# Patient Record
Sex: Male | Born: 1965 | Race: White | Hispanic: No | Marital: Single | Smoking: Current every day smoker
Health system: Southern US, Community
[De-identification: ages and names within clinical notes are randomized; demographics above are authoritative.]

## PROBLEM LIST (undated history)

## (undated) DIAGNOSIS — T4145XA Adverse effect of unspecified anesthetic, initial encounter: Secondary | ICD-10-CM

## (undated) DIAGNOSIS — I4891 Unspecified atrial fibrillation: Secondary | ICD-10-CM

## (undated) DIAGNOSIS — M1990 Unspecified osteoarthritis, unspecified site: Secondary | ICD-10-CM

## (undated) DIAGNOSIS — I719 Aortic aneurysm of unspecified site, without rupture: Secondary | ICD-10-CM

## (undated) DIAGNOSIS — T8859XA Other complications of anesthesia, initial encounter: Secondary | ICD-10-CM

## (undated) DIAGNOSIS — K219 Gastro-esophageal reflux disease without esophagitis: Secondary | ICD-10-CM

## (undated) DIAGNOSIS — E119 Type 2 diabetes mellitus without complications: Secondary | ICD-10-CM

## (undated) DIAGNOSIS — Q2381 Bicuspid aortic valve: Secondary | ICD-10-CM

## (undated) DIAGNOSIS — Z91018 Allergy to other foods: Secondary | ICD-10-CM

## (undated) DIAGNOSIS — U071 COVID-19: Secondary | ICD-10-CM

## (undated) HISTORY — PX: STAPEDECTOMY: SHX2435

## (undated) HISTORY — PX: HERNIA REPAIR: SHX51

## (undated) HISTORY — PX: ATRIAL FIBRILLATION ABLATION: SHX5732

---

## 2015-08-15 ENCOUNTER — Encounter (HOSPITAL_COMMUNITY)
Admission: RE | Disposition: A | Discharge status: Home / Self Care | Payer: Self-pay | Source: Ambulatory Visit | Attending: Cardiovascular Disease

## 2015-08-15 ENCOUNTER — Ambulatory Visit (HOSPITAL_COMMUNITY)
Admission: RE | Admit: 2015-08-15 | Discharge: 2015-08-15 | Disposition: A | Discharge status: Home / Self Care | Payer: No Typology Code available for payment source | Source: Ambulatory Visit | Attending: Cardiovascular Disease | Admitting: Cardiovascular Disease

## 2015-08-15 ENCOUNTER — Encounter (HOSPITAL_COMMUNITY): Payer: Self-pay

## 2015-08-15 ENCOUNTER — Ambulatory Visit (HOSPITAL_COMMUNITY)
Admission: RE | Admit: 2015-08-15 | Discharge: 2015-08-15 | Disposition: A | Discharge status: Home / Self Care | Payer: No Typology Code available for payment source | Source: Ambulatory Visit | Admitting: Cardiovascular Disease

## 2015-08-15 DIAGNOSIS — R0609 Other forms of dyspnea: Secondary | ICD-10-CM | POA: Diagnosis present

## 2015-08-15 DIAGNOSIS — I712 Thoracic aortic aneurysm, without rupture: Secondary | ICD-10-CM | POA: Insufficient documentation

## 2015-08-15 DIAGNOSIS — I351 Nonrheumatic aortic (valve) insufficiency: Secondary | ICD-10-CM | POA: Diagnosis not present

## 2015-08-15 DIAGNOSIS — F17200 Nicotine dependence, unspecified, uncomplicated: Secondary | ICD-10-CM | POA: Diagnosis not present

## 2015-08-15 DIAGNOSIS — I429 Cardiomyopathy, unspecified: Secondary | ICD-10-CM | POA: Diagnosis not present

## 2015-08-15 DIAGNOSIS — Z7982 Long term (current) use of aspirin: Secondary | ICD-10-CM | POA: Insufficient documentation

## 2015-08-15 DIAGNOSIS — M1990 Unspecified osteoarthritis, unspecified site: Secondary | ICD-10-CM | POA: Insufficient documentation

## 2015-08-15 DIAGNOSIS — Q211 Atrial septal defect: Secondary | ICD-10-CM | POA: Diagnosis not present

## 2015-08-15 HISTORY — DX: Unspecified osteoarthritis, unspecified site: M19.90

## 2015-08-15 HISTORY — PX: TEE WITHOUT CARDIOVERSION: SHX5443

## 2015-08-15 HISTORY — DX: Adverse effect of unspecified anesthetic, initial encounter: T41.45XA

## 2015-08-15 HISTORY — DX: Other complications of anesthesia, initial encounter: T88.59XA

## 2015-08-15 SURGERY — ECHOCARDIOGRAM, TRANSESOPHAGEAL
Anesthesia: Moderate Sedation

## 2015-08-15 MED ORDER — FENTANYL CITRATE (PF) 100 MCG/2ML IJ SOLN
INTRAMUSCULAR | Status: DC | PRN
  Administered 2015-08-15 (×3): 25 ug via INTRAVENOUS

## 2015-08-15 MED ORDER — BUTAMBEN-TETRACAINE-BENZOCAINE 2-2-14 % EX AERO
INHALATION_SPRAY | CUTANEOUS | Status: DC | PRN
  Administered 2015-08-15: 2 via TOPICAL

## 2015-08-15 MED ORDER — FENTANYL CITRATE (PF) 100 MCG/2ML IJ SOLN
INTRAMUSCULAR | Status: AC
  Filled 2015-08-15: qty 2

## 2015-08-15 MED ORDER — SODIUM CHLORIDE 0.9 % IV SOLN
INTRAVENOUS | Status: DC
Start: 2015-08-15 — End: 2015-08-15
  Administered 2015-08-15: 500 mL via INTRAVENOUS

## 2015-08-15 MED ORDER — MIDAZOLAM HCL 5 MG/ML IJ SOLN
INTRAMUSCULAR | Status: AC
  Filled 2015-08-15: qty 2

## 2015-08-15 MED ORDER — MIDAZOLAM HCL 10 MG/2ML IJ SOLN
INTRAMUSCULAR | Status: DC | PRN
  Administered 2015-08-15 (×3): 1 mg via INTRAVENOUS

## 2015-08-15 MED ORDER — DIPHENHYDRAMINE HCL 50 MG/ML IJ SOLN
INTRAMUSCULAR | Status: AC
  Filled 2015-08-15: qty 1

## 2015-08-15 NOTE — OR Nursing (Signed)
Pt. Discharge instructions unprintable.  MD informed.  Discharge instructions given verbally and written down.  Pt. And family understand teaching and instructions given.    Omelia BlackwaterShelby Rayven Hendrickson, RN

## 2015-08-15 NOTE — Discharge Instructions (Signed)

## 2015-08-15 NOTE — Progress Notes (Signed)
  Echocardiogram Echocardiogram Transesophageal has been performed.  Leta JunglingCooper, Makyra Corprew M 08/15/2015, 1:21 PM

## 2015-08-15 NOTE — CV Procedure (Signed)
INDICATIONS:   The patient is 49 year old male with h/o aortic aneurysm, endocarditis has exertional dyspnea.  PROCEDURE:  Informed consent was discussed including risks, benefits and alternatives for the procedure.  Risks include, but are not limited to, cough, sore throat, vomiting, nausea, somnolence, esophageal and stomach trauma or perforation, bleeding, low blood pressure, aspiration, pneumonia, infection, trauma to the teeth and death.    Patient was given sedation.  The oropharynx was anesthetized with topical lidocaine.  The transesophageal probe was inserted in the esophagus and stomach and multiple views were obtained.  Agitated saline was used after the transesophageal probe was removed from the body.  The patient was kept under observation until the patient left the procedure room.  The patient left the procedure room in stable condition.   COMPLICATIONS:  There were no immediate complications.  FINDINGS:  1. LEFT VENTRICLE: The left ventricle is normal in structure and mild systolic dysfunction.  Wall motion is near normal. EF 50 %. No thrombus or masses seen in the left ventricle.  2. RIGHT VENTRICLE:  The right ventricle is normal in structure and function without any thrombus or masses.    3. LEFT ATRIUM:  The left atrium is normal without any thrombus or masses.  4. LEFT ATRIAL APPENDAGE:  The left atrial appendage is free of any thrombus or masses.  5. RIGHT ATRIUM:  The right atrium is free of any thrombus or masses.    6. ATRIAL SEPTUM:  The atrial septum is normal with patent PFO seen by sonicated saline injection.  7. MITRAL VALVE:  The mitral valve is normal in structure and function with minimal regurgitation, no masses, stenosis or vegetations.  8. TRICUSPID VALVE:  The tricuspid valve is normal in structure and function with minimal regurgitation, no masses, stenosis or vegetations.  9. AORTIC VALVE:  The aortic valve is almost bicuspid with very small left  coronary cusp. and with mild regurgitation, no masses, stenosis or vegetations.   10. PULMONIC VALVE:  The pulmonic valve is normal in structure and function without regurgitation, masses, stenosis or vegetations.  11. AORTIC ARCH, ASCENDING AND DESCENDING AORTA:  The aorta had mild diffuse atherosclerosis in the ascending or descending aorta.  The aortic arch was normal. The ascending aorta measured about 4 cm in diameter.  IMPRESSION:   1. Ascending aortic aneurysm measuring up to 4 cm in diameter without dissection. 2. Mild LV systolic dysfunction with EF 50 %. 3. Almost biscuspid aortic valve with mild Aortic insufficiency. 4. PFO by sonicated saline injection. 5. Minimal MR and TR.  RECOMMENDATIONS:    Medical treatment with B-blocker use. Patient understood not to do weight lifting and reduce stress as much as possible.

## 2015-08-15 NOTE — H&P (Signed)
Referring Physician:  Nanetta BattyChristopher [name] is an 49 y.o. male.                       Chief Complaint: Exertional dyspnea  HPI: 49 year old male with known history of exertional dyspnea, cardiomyopathy and thoracic aortic aneurysm is here for additional evaluation.  Past Medical History  Diagnosis Date  . Complication of anesthesia     Uvula swelled during general anesthesia  . Arthritis       History reviewed. No pertinent past surgical history.  History reviewed. No pertinent family history. Social History:  reports that he has been smoking.  He uses smokeless tobacco. He reports that he drinks alcohol. He reports that he does not use illicit drugs.  Allergies:  Allergies  Allergen Reactions  . Penicillins Rash    Medications Prior to Admission  Medication Sig Dispense Refill  . aspirin 81 MG tablet Take 81 mg by mouth daily.      No results found for this or any previous visit (from the past 48 hour([name])). No results found.  Review Of Systems   Blood pressure 144/88, pulse 61, temperature 98.8 F (37.1 C), temperature source Oral, resp. rate 19, height 6' (1.829 m), weight 86.183 kg (190 lb), SpO2 100 %. General appearance: alert and cooperative Head: Normocephalic, without obvious abnormality, atraumatic. Light Blue eyes, Non-icteric. Neck: no adenopathy, no carotid bruit, no JVD, supple, symmetrical, trachea midline and thyroid not enlarged, symmetric, no tenderness/mass/nodules Resp: clear to auscultation bilaterally Cardio: regular rate and rhythm, S1, S2 normal, II/VI systolic and diastolic murmur, no click, rub or gallop Extremities: extremities normal, atraumatic, no cyanosis or edema Skin: Skin color, texture, turgor normal. No rashes or lesions Neurologic: Alert and oriented X 3, normal strength and tone. Normal symmetric reflexes. Normal coordination and gait  Assessment/Plan Thoracic aortic aneurysm Exertional dyspnea H/o endocarditis  TEE  today.  Kevin [name],Kevin [name] S, MD  08/15/2015, 12:21 PM

## 2015-08-18 ENCOUNTER — Encounter (HOSPITAL_COMMUNITY): Payer: Self-pay | Admitting: Cardiovascular Disease

## 2015-08-29 ENCOUNTER — Ambulatory Visit (HOSPITAL_COMMUNITY)
Admission: RE | Admit: 2015-08-29 | Discharge: 2015-08-29 | Disposition: A | Discharge status: Home / Self Care | Payer: No Typology Code available for payment source | Source: Ambulatory Visit | Attending: Cardiovascular Disease | Admitting: Cardiovascular Disease

## 2015-08-29 ENCOUNTER — Encounter (HOSPITAL_COMMUNITY): Payer: Self-pay | Admitting: Cardiovascular Disease

## 2015-08-29 ENCOUNTER — Encounter (HOSPITAL_COMMUNITY)
Admission: RE | Disposition: A | Discharge status: Home / Self Care | Payer: Self-pay | Source: Ambulatory Visit | Attending: Cardiovascular Disease

## 2015-08-29 DIAGNOSIS — Z880 Allergy status to penicillin: Secondary | ICD-10-CM | POA: Diagnosis not present

## 2015-08-29 DIAGNOSIS — I712 Thoracic aortic aneurysm, without rupture: Secondary | ICD-10-CM | POA: Insufficient documentation

## 2015-08-29 DIAGNOSIS — R0602 Shortness of breath: Secondary | ICD-10-CM | POA: Diagnosis present

## 2015-08-29 DIAGNOSIS — I429 Cardiomyopathy, unspecified: Secondary | ICD-10-CM | POA: Diagnosis not present

## 2015-08-29 DIAGNOSIS — R079 Chest pain, unspecified: Secondary | ICD-10-CM | POA: Diagnosis present

## 2015-08-29 DIAGNOSIS — I358 Other nonrheumatic aortic valve disorders: Secondary | ICD-10-CM | POA: Insufficient documentation

## 2015-08-29 DIAGNOSIS — R0609 Other forms of dyspnea: Secondary | ICD-10-CM | POA: Insufficient documentation

## 2015-08-29 DIAGNOSIS — Z7982 Long term (current) use of aspirin: Secondary | ICD-10-CM | POA: Diagnosis not present

## 2015-08-29 DIAGNOSIS — F17210 Nicotine dependence, cigarettes, uncomplicated: Secondary | ICD-10-CM | POA: Diagnosis not present

## 2015-08-29 HISTORY — PX: CARDIAC CATHETERIZATION: SHX172

## 2015-08-29 LAB — CBC
HEMATOCRIT: 42.9 % (ref 39.0–52.0)
HEMOGLOBIN: 14.2 g/dL (ref 13.0–17.0)
MCH: 30.4 pg (ref 26.0–34.0)
MCHC: 33.1 g/dL (ref 30.0–36.0)
MCV: 91.9 fL (ref 78.0–100.0)
Platelets: 233 10*3/uL (ref 150–400)
RBC: 4.67 MIL/uL (ref 4.22–5.81)
RDW: 13.4 % (ref 11.5–15.5)
WBC: 7.1 10*3/uL (ref 4.0–10.5)

## 2015-08-29 LAB — BASIC METABOLIC PANEL
Anion gap: 10 (ref 5–15)
BUN: 11 mg/dL (ref 6–20)
CHLORIDE: 106 mmol/L (ref 101–111)
CO2: 27 mmol/L (ref 22–32)
CREATININE: 1.1 mg/dL (ref 0.61–1.24)
Calcium: 9.3 mg/dL (ref 8.9–10.3)
GFR calc non Af Amer: >60 mL/min (ref >60)
Glucose, Bld: 123 mg/dL — ABNORMAL HIGH (ref 65–99)
Potassium: 4.1 mmol/L (ref 3.5–5.1)
Sodium: 143 mmol/L (ref 135–145)

## 2015-08-29 LAB — PROTIME-INR
INR: 1.07 (ref 0.00–1.49)
Prothrombin Time: 14.1 seconds (ref 11.6–15.2)

## 2015-08-29 SURGERY — LEFT HEART CATH AND CORONARY ANGIOGRAPHY
Anesthesia: LOCAL

## 2015-08-29 MED ORDER — MIDAZOLAM HCL 2 MG/2ML IJ SOLN
INTRAMUSCULAR | Status: DC | PRN
  Administered 2015-08-29: 1 mg via INTRAVENOUS

## 2015-08-29 MED ORDER — ONDANSETRON HCL 4 MG/2ML IJ SOLN: 4.0000 mg | Freq: Four times a day (QID) | INTRAMUSCULAR | Status: DC | PRN

## 2015-08-29 MED ORDER — FENTANYL CITRATE (PF) 100 MCG/2ML IJ SOLN
INTRAMUSCULAR | Status: DC | PRN
  Administered 2015-08-29: 25 ug via INTRAVENOUS

## 2015-08-29 MED ORDER — ACETAMINOPHEN 325 MG PO TABS: 650.0000 mg | ORAL_TABLET | ORAL | Status: DC | PRN

## 2015-08-29 MED ORDER — ASPIRIN 81 MG PO CHEW
81.0000 mg | CHEWABLE_TABLET | ORAL | Status: AC
Start: 2015-08-30 — End: 2015-08-29
  Administered 2015-08-29: 81 mg via ORAL

## 2015-08-29 MED ORDER — SODIUM CHLORIDE 0.9 % IV SOLN: 250.0000 mL | INTRAVENOUS | Status: DC | PRN

## 2015-08-29 MED ORDER — SODIUM CHLORIDE 0.9 % IV SOLN
INTRAVENOUS | Status: DC
  Administered 2015-08-29: 08:00:00 via INTRAVENOUS

## 2015-08-29 MED ORDER — METOPROLOL TARTRATE 25 MG PO TABS: 25.0000 mg | ORAL_TABLET | Freq: Two times a day (BID) | ORAL | Status: DC

## 2015-08-29 MED ORDER — SODIUM CHLORIDE 0.9 % IJ SOLN: 3.0000 mL | Freq: Two times a day (BID) | INTRAMUSCULAR | Status: DC

## 2015-08-29 MED ORDER — NITROGLYCERIN 1 MG/10 ML FOR IR/CATH LAB
INTRA_ARTERIAL | Status: AC
  Filled 2015-08-29: qty 10

## 2015-08-29 MED ORDER — LIDOCAINE HCL (PF) 1 % IJ SOLN
INTRAMUSCULAR | Status: DC | PRN
  Administered 2015-08-29: 16 mL

## 2015-08-29 MED ORDER — ASPIRIN 81 MG PO CHEW
CHEWABLE_TABLET | ORAL | Status: AC
  Filled 2015-08-29: qty 1

## 2015-08-29 MED ORDER — LIDOCAINE HCL (PF) 1 % IJ SOLN
INTRAMUSCULAR | Status: AC
  Filled 2015-08-29: qty 30

## 2015-08-29 MED ORDER — SODIUM CHLORIDE 0.9 % IJ SOLN: 3.0000 mL | INTRAMUSCULAR | Status: DC | PRN

## 2015-08-29 MED ORDER — HEPARIN (PORCINE) IN NACL 2-0.9 UNIT/ML-% IJ SOLN
INTRAMUSCULAR | Status: AC
  Filled 2015-08-29: qty 1000

## 2015-08-29 MED ORDER — FENTANYL CITRATE (PF) 100 MCG/2ML IJ SOLN
INTRAMUSCULAR | Status: AC
  Filled 2015-08-29: qty 4

## 2015-08-29 MED ORDER — SODIUM CHLORIDE 0.9 % IV SOLN: INTRAVENOUS | Status: AC

## 2015-08-29 MED ORDER — MIDAZOLAM HCL 2 MG/2ML IJ SOLN
INTRAMUSCULAR | Status: AC
  Filled 2015-08-29: qty 4

## 2015-08-29 SURGICAL SUPPLY — 8 items
CATH INFINITI 5FR JL5 (CATHETERS) ×2 IMPLANT
CATH INFINITI 5FR MULTPACK ANG (CATHETERS) ×2 IMPLANT
KIT HEART LEFT (KITS) ×2 IMPLANT
PACK CARDIAC CATHETERIZATION (CUSTOM PROCEDURE TRAY) ×2 IMPLANT
SHEATH PINNACLE 5F 10CM (SHEATH) ×2 IMPLANT
SYR MEDRAD MARK V 150ML (SYRINGE) ×2 IMPLANT
TRANSDUCER W/STOPCOCK (MISCELLANEOUS) ×2 IMPLANT
WIRE EMERALD 3MM-J .035X150CM (WIRE) ×2 IMPLANT

## 2015-08-29 NOTE — Progress Notes (Signed)
Site area: RFA Site Prior to Removal:  Level 0 Pressure Applied For:6224min Manual:   yes Patient Status During Pull: stable  Post Pull Site:  Level 0 Post Pull Instructions Given:  yes Post Pull Pulses Present: palpable Dressing Applied:  clear Bedrest begins @ 1015 till 1415 Comments:

## 2015-08-29 NOTE — H&P (Signed)
Referring Physician:  Nanetta BattyChristopher [name] is an 49 y.o. male.                       Chief Complaint: Exertional dyspnea  HPI: 49 year old male with known history of exertional dyspnea, cardiomyopathy and thoracic aortic aneurysm is here for additional evaluation.  Past Medical History  Diagnosis Date  . Complication of anesthesia     Uvula swelled during general anesthesia  . Arthritis       Past Surgical History  Procedure Laterality Date  . Tee without cardioversion N/A 08/15/2015    Procedure: TRANSESOPHAGEAL ECHOCARDIOGRAM (TEE);  Surgeon: Orpah CobbAjay Tierany Appleby, MD;  [facility];  Service: Cardiovascular;  Laterality: N/A;    No family history on file. Social History:  reports that he has been smoking.  He uses smokeless tobacco. He reports that he drinks alcohol. He reports that he does not use illicit drugs.  Allergies:  Allergies  Allergen Reactions  . Penicillins     Has patient had a PCN reaction causing immediate rash, facial/tongue/throat swelling, SOB or lightheadedness with hypotension: unknown Has patient had a PCN reaction causing severe rash involving mucus membranes or skin necrosis: unknown Has patient had a PCN reaction that required hospitalization unknown Has patient had a PCN reaction occurring within the last 10 years: no If all of the above answers are "NO", then may proceed with Cephalosporin use.     Medications Prior to Admission  Medication Sig Dispense Refill  . aspirin 81 MG tablet Take 81 mg by mouth daily.    . metoprolol (LOPRESSOR) 50 MG tablet Take 25 mg by mouth 2 (two) times daily.      No results found for this or any previous visit (from the past 48 hour(s)). No results found.  Review Of Systems No weight gain/loss, No hearing loss, wears reading glasses, + exertional dyspnea, + chest pain, No COPD, NO GI or GU bleed, No hepatitis, No kidney stone, No stroke, seizures or psychiatric admission.  Blood pressure 111/63, pulse 59,  temperature 97.8 F (36.6 C), temperature source Oral, resp. rate 18, height 6\' 1"  (1.854 m), weight 86.183 kg (190 lb), SpO2 100 %.  General appearance: alert and cooperative Head: Normocephalic, without obvious abnormality, atraumatic. Light Blue eyes, Non-icteric. Neck: no adenopathy, no carotid bruit, no JVD, supple, symmetrical, trachea midline and thyroid not enlarged, symmetric, no tenderness/mass/nodules Resp: clear to auscultation bilaterally Cardio: regular rate and rhythm, S1, S2 normal, II/VI systolic and diastolic murmur, no click, rub or gallop Extremities: extremities normal, atraumatic, no cyanosis or edema Skin: Skin color, texture, turgor normal. No rashes or lesions Neurologic: Alert and oriented X 3, normal strength and tone. Normal symmetric reflexes. Normal coordination and gait  Assessment/Plan Exertional dyspnea Thoracic aortic aneurysm Chest pain R/O CAD  Cardiac cath today. Patient understood risk, alternatives and benefits.   Ricki RodriguezKADAKIA,Teia Freitas S, MD  08/29/2015, 7:37 AM

## 2015-08-29 NOTE — Progress Notes (Signed)
Report received from Janice Walker,RN 

## 2015-08-29 NOTE — Discharge Instructions (Signed)

## 2015-09-01 ENCOUNTER — Other Ambulatory Visit: Payer: Self-pay | Admitting: Cardiovascular Disease

## 2015-09-01 DIAGNOSIS — I712 Thoracic aortic aneurysm, without rupture, unspecified: Secondary | ICD-10-CM

## 2015-09-05 ENCOUNTER — Other Ambulatory Visit: Payer: PRIVATE HEALTH INSURANCE

## 2015-09-10 ENCOUNTER — Other Ambulatory Visit: Payer: PRIVATE HEALTH INSURANCE

## 2015-10-13 ENCOUNTER — Ambulatory Visit
Admission: RE | Admit: 2015-10-13 | Discharge: 2015-10-13 | Disposition: A | Discharge status: Home / Self Care | Payer: PRIVATE HEALTH INSURANCE | Source: Ambulatory Visit | Attending: Cardiovascular Disease | Admitting: Cardiovascular Disease

## 2015-10-13 DIAGNOSIS — I712 Thoracic aortic aneurysm, without rupture, unspecified: Secondary | ICD-10-CM

## 2015-10-13 IMAGING — CT CT ANGIO CHEST
2 of 5 series · 9 of 30 positions shown · IV contrast (75CC ISOVUE 370)
Comparison: None.

CLINICAL DATA: Thoracic aortic aneurysm without rupture.

EXAM:
CT ANGIOGRAPHY CHEST WITH CONTRAST
TECHNIQUE: Multidetector CT imaging of the chest was performed using the
standard protocol during bolus administration of intravenous
contrast. Multiplanar CT image reconstructions and MIPs were
obtained to evaluate the vascular anatomy.
CONTRAST:  75 mL Isovue 370 IV

[Series 4: angio · axial · 0.78mm/px · z∈[-245,-35]mm · 4 of 141 slices shown]
[im 29/141  lung]
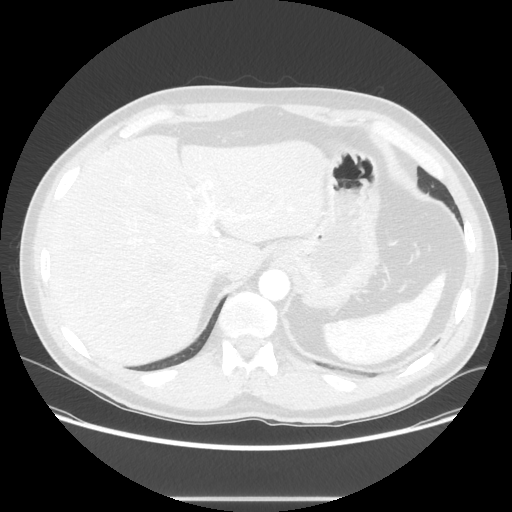
[im 57/141  mediastinal]
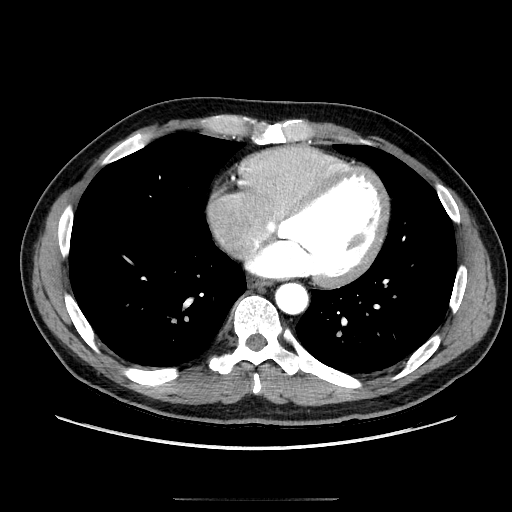
[im 85/141  lung]
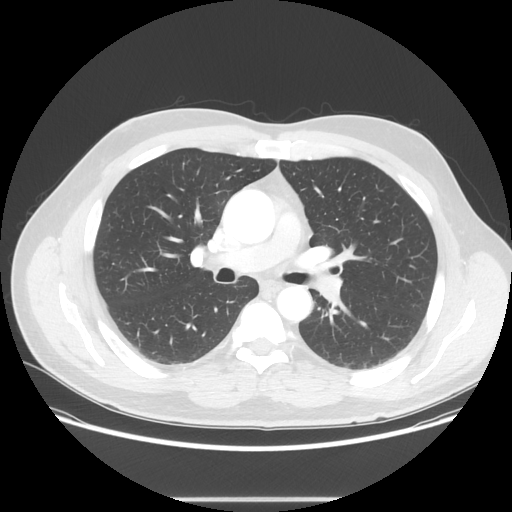
[im 113/141  mediastinal]
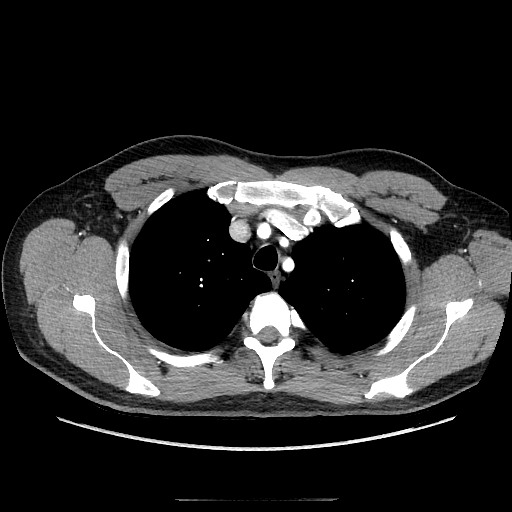

[Series 602: sagittal body · sagittal · 0.78mm/px · 5 of 161 slices shown]
[im 27/161  lung]
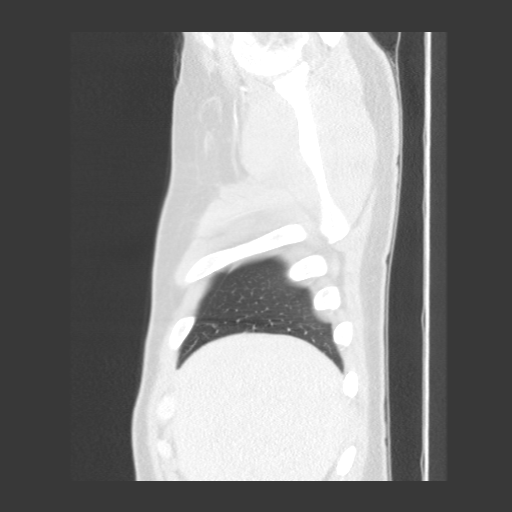
[im 54/161  lung]
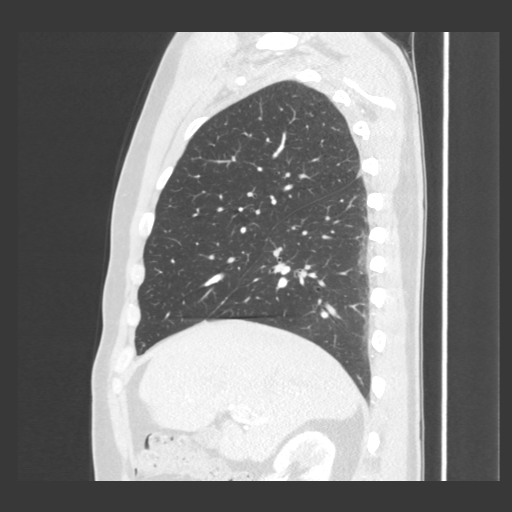
[im 81/161  lung]
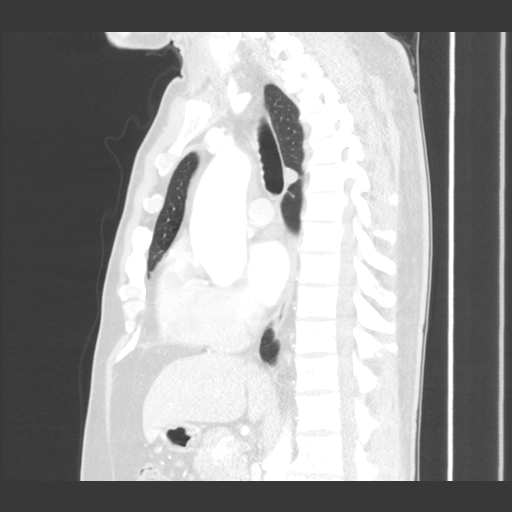
[im 107/161  lung]
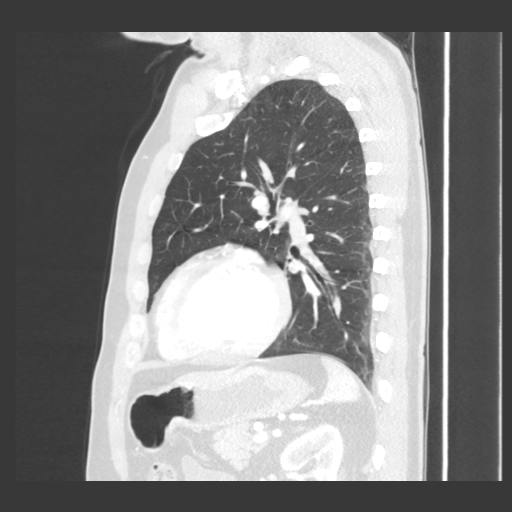
[im 134/161  lung]
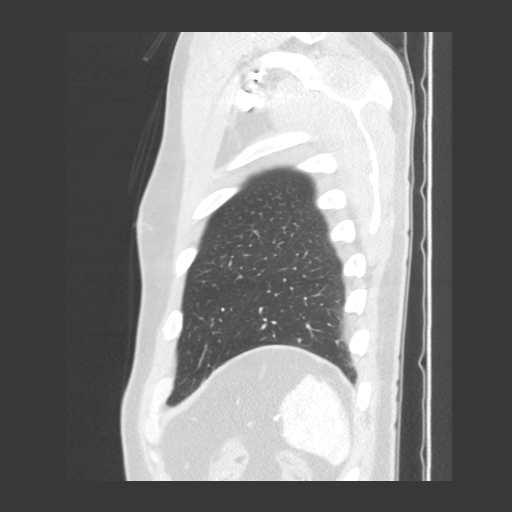

[9 of 30 positions shown; findings below may reference images not displayed]

FINDINGS: Ascending aorta measures 4.5 x 3.9 cm. No saccular aneurysm.
Negative for dissection. Descending thoracic aorta measures 2.4 cm.

Heart size is normal. No pericardial effusion. Negative for Coronary
calcification.

Pulmonary arteries normal in caliber but not adequately opacified to
evaluate for pulmonary emboli.

Lungs are clear without infiltrate or effusion. Negative for mass or
adenopathy.

Thoracic spine is normal.  No focal bony lesion.

Review of the MIP images confirms the above findings.
IMPRESSION: Fusiform dilatation of the ascending aorta measures 4.5 x 3.9 cm.
Negative for dissection.

The lungs are clear.  No acute abnormality.

The Ascending thoracic aortic aneurysm. Recommend semi-annual
imaging followup by CTA or MRA and referral to cardiothoracic
surgery if not already obtained. This recommendation follows [OO]
ACCF/AHA/AATS/ACR/ASA/SCA/AUSTRIA/AUSTRIA/AUSTRIA/AUSTRIA Guidelines for the
Diagnosis and Management of Patients With Thoracic Aortic Disease.
Circulation. [OO]; 121: [id number]

## 2015-10-13 MED ORDER — IOPAMIDOL (ISOVUE-370) INJECTION 76%
75.0000 mL | Freq: Once | INTRAVENOUS | Status: AC | PRN
  Administered 2015-10-13: 75 mL via INTRAVENOUS

## 2024-02-17 ENCOUNTER — Emergency Department (HOSPITAL_COMMUNITY)

## 2024-02-17 ENCOUNTER — Inpatient Hospital Stay (HOSPITAL_COMMUNITY)
Admission: EM | Admit: 2024-02-17 | Discharge: 2024-02-20 | DRG: 493 | Disposition: A | Discharge status: Home Health | Attending: Orthopedic Surgery | Admitting: Orthopedic Surgery

## 2024-02-17 ENCOUNTER — Encounter (HOSPITAL_COMMUNITY): Payer: Self-pay

## 2024-02-17 ENCOUNTER — Other Ambulatory Visit: Payer: Self-pay

## 2024-02-17 DIAGNOSIS — M1990 Unspecified osteoarthritis, unspecified site: Secondary | ICD-10-CM | POA: Diagnosis present

## 2024-02-17 DIAGNOSIS — Q2381 Bicuspid aortic valve: Secondary | ICD-10-CM | POA: Diagnosis not present

## 2024-02-17 DIAGNOSIS — S82201A Unspecified fracture of shaft of right tibia, initial encounter for closed fracture: Secondary | ICD-10-CM | POA: Diagnosis not present

## 2024-02-17 DIAGNOSIS — I4891 Unspecified atrial fibrillation: Secondary | ICD-10-CM | POA: Diagnosis present

## 2024-02-17 DIAGNOSIS — K219 Gastro-esophageal reflux disease without esophagitis: Secondary | ICD-10-CM | POA: Diagnosis present

## 2024-02-17 DIAGNOSIS — E1122 Type 2 diabetes mellitus with diabetic chronic kidney disease: Secondary | ICD-10-CM | POA: Diagnosis present

## 2024-02-17 DIAGNOSIS — F17220 Nicotine dependence, chewing tobacco, uncomplicated: Secondary | ICD-10-CM | POA: Diagnosis present

## 2024-02-17 DIAGNOSIS — N1831 Chronic kidney disease, stage 3a: Secondary | ICD-10-CM | POA: Diagnosis present

## 2024-02-17 DIAGNOSIS — S82251A Displaced comminuted fracture of shaft of right tibia, initial encounter for closed fracture: Secondary | ICD-10-CM | POA: Diagnosis present

## 2024-02-17 DIAGNOSIS — D62 Acute posthemorrhagic anemia: Secondary | ICD-10-CM | POA: Diagnosis present

## 2024-02-17 HISTORY — DX: Type 2 diabetes mellitus without complications: E11.9

## 2024-02-17 HISTORY — DX: Gastro-esophageal reflux disease without esophagitis: K21.9

## 2024-02-17 HISTORY — DX: Unspecified atrial fibrillation: I48.91

## 2024-02-17 HISTORY — DX: Allergy to other foods: Z91.018

## 2024-02-17 HISTORY — DX: Bicuspid aortic valve: Q23.81

## 2024-02-17 HISTORY — DX: COVID-19: U07.1

## 2024-02-17 HISTORY — DX: Aortic aneurysm of unspecified site, without rupture: I71.9

## 2024-02-17 LAB — COMPREHENSIVE METABOLIC PANEL WITH GFR
ALT: 48 U/L — ABNORMAL HIGH (ref 0–44)
AST: 33 U/L (ref 15–41)
Albumin: 4.4 g/dL (ref 3.5–5.0)
Alkaline Phosphatase: 72 U/L (ref 38–126)
Anion gap: 13 (ref 5–15)
BUN: 12 mg/dL (ref 6–20)
CO2: 22 mmol/L (ref 22–32)
Calcium: 9.5 mg/dL (ref 8.9–10.3)
Chloride: 103 mmol/L (ref 98–111)
Creatinine, Ser: 1.44 mg/dL — ABNORMAL HIGH (ref 0.61–1.24)
GFR, Estimated: 56 mL/min — ABNORMAL LOW (ref >60)
Glucose, Bld: 173 mg/dL — ABNORMAL HIGH (ref 70–99)
Potassium: 4.2 mmol/L (ref 3.5–5.1)
Sodium: 138 mmol/L (ref 135–145)
Total Bilirubin: 0.9 mg/dL (ref 0.0–1.2)
Total Protein: 7 g/dL (ref 6.5–8.1)

## 2024-02-17 LAB — SAMPLE TO BLOOD BANK

## 2024-02-17 LAB — I-STAT CHEM 8, ED
BUN: 14 mg/dL (ref 6–20)
Calcium, Ion: 1.06 mmol/L — ABNORMAL LOW (ref 1.15–1.40)
Chloride: 106 mmol/L (ref 98–111)
Creatinine, Ser: 1.5 mg/dL — ABNORMAL HIGH (ref 0.61–1.24)
Glucose, Bld: 168 mg/dL — ABNORMAL HIGH (ref 70–99)
HCT: 47 % (ref 39.0–52.0)
Hemoglobin: 16 g/dL (ref 13.0–17.0)
Potassium: 4.1 mmol/L (ref 3.5–5.1)
Sodium: 139 mmol/L (ref 135–145)
TCO2: 24 mmol/L (ref 22–32)

## 2024-02-17 LAB — CBC
HCT: 47.9 % (ref 39.0–52.0)
Hemoglobin: 15.4 g/dL (ref 13.0–17.0)
MCH: 30.4 pg (ref 26.0–34.0)
MCHC: 32.2 g/dL (ref 30.0–36.0)
MCV: 94.5 fL (ref 80.0–100.0)
Platelets: 289 10*3/uL (ref 150–400)
RBC: 5.07 MIL/uL (ref 4.22–5.81)
RDW: 13.3 % (ref 11.5–15.5)
WBC: 12.7 10*3/uL — ABNORMAL HIGH (ref 4.0–10.5)
nRBC: 0 % (ref 0.0–0.2)

## 2024-02-17 LAB — PROTIME-INR
INR: 1 (ref 0.8–1.2)
Prothrombin Time: 13.8 s (ref 11.4–15.2)

## 2024-02-17 LAB — ETHANOL: Alcohol, Ethyl (B): <10 mg/dL (ref <10)

## 2024-02-17 LAB — I-STAT CG4 LACTIC ACID, ED: Lactic Acid, Venous: 1.7 mmol/L (ref 0.5–1.9)

## 2024-02-17 MED ORDER — OXYCODONE HCL 5 MG PO TABS
5.0000 mg | ORAL_TABLET | ORAL | Status: DC | PRN
  Administered 2024-02-18 (×2): 10 mg via ORAL
  Administered 2024-02-18: 5 mg via ORAL
  Filled 2024-02-17 (×2): qty 2
  Filled 2024-02-17: qty 1

## 2024-02-17 MED ORDER — METOPROLOL TARTRATE 25 MG PO TABS: 25.0000 mg | ORAL_TABLET | Freq: Two times a day (BID) | ORAL | Status: DC

## 2024-02-17 MED ORDER — POLYETHYLENE GLYCOL 3350 17 G PO PACK
17.0000 g | PACK | Freq: Every day | ORAL | Status: DC
  Administered 2024-02-19: 17 g via ORAL
  Filled 2024-02-17 (×3): qty 1

## 2024-02-17 MED ORDER — FENTANYL CITRATE PF 50 MCG/ML IJ SOSY
50.0000 ug | PREFILLED_SYRINGE | INTRAMUSCULAR | Status: DC | PRN
  Administered 2024-02-17 (×2): 50 ug via INTRAVENOUS
  Filled 2024-02-17 (×2): qty 1

## 2024-02-17 MED ORDER — TRANEXAMIC ACID-NACL 1000-0.7 MG/100ML-% IV SOLN
1000.0000 mg | INTRAVENOUS | Status: AC
  Administered 2024-02-18: 1000 mg via INTRAVENOUS

## 2024-02-17 MED ORDER — SENNOSIDES-DOCUSATE SODIUM 8.6-50 MG PO TABS
1.0000 | ORAL_TABLET | Freq: Two times a day (BID) | ORAL | Status: DC
  Administered 2024-02-18 – 2024-02-20 (×5): 1 via ORAL
  Filled 2024-02-17 (×6): qty 1

## 2024-02-17 MED ORDER — CEFAZOLIN SODIUM-DEXTROSE 2-4 GM/100ML-% IV SOLN
2.0000 g | INTRAVENOUS | Status: AC
  Administered 2024-02-18: 2 g via INTRAVENOUS
  Filled 2024-02-17: qty 100

## 2024-02-17 MED ORDER — HYDROMORPHONE HCL 1 MG/ML IJ SOLN
0.5000 mg | INTRAMUSCULAR | Status: DC | PRN
  Administered 2024-02-18: 0.5 mg via INTRAVENOUS
  Filled 2024-02-17: qty 1

## 2024-02-17 MED ORDER — ONDANSETRON HCL 4 MG PO TABS
4.0000 mg | ORAL_TABLET | Freq: Four times a day (QID) | ORAL | Status: DC | PRN
Start: 2024-02-17 — End: 2024-02-20

## 2024-02-17 MED ORDER — HYDROMORPHONE HCL 1 MG/ML IJ SOLN
1.0000 mg | Freq: Once | INTRAMUSCULAR | Status: AC
  Administered 2024-02-17: 1 mg via INTRAVENOUS
  Filled 2024-02-17: qty 1

## 2024-02-17 MED ORDER — ONDANSETRON HCL 4 MG/2ML IJ SOLN
4.0000 mg | Freq: Four times a day (QID) | INTRAMUSCULAR | Status: DC | PRN
Start: 2024-02-17 — End: 2024-02-20
  Administered 2024-02-18: 4 mg via INTRAVENOUS
  Filled 2024-02-17: qty 2

## 2024-02-17 MED ORDER — FENTANYL CITRATE PF 50 MCG/ML IJ SOSY: 100.0000 ug | PREFILLED_SYRINGE | Freq: Once | INTRAMUSCULAR | Status: AC

## 2024-02-17 MED ORDER — ACETAMINOPHEN 500 MG PO TABS
1000.0000 mg | ORAL_TABLET | Freq: Three times a day (TID) | ORAL | Status: DC
  Administered 2024-02-18 – 2024-02-20 (×7): 1000 mg via ORAL
  Filled 2024-02-17 (×8): qty 2

## 2024-02-17 MED ORDER — METHOCARBAMOL 500 MG PO TABS
500.0000 mg | ORAL_TABLET | Freq: Four times a day (QID) | ORAL | Status: DC
  Administered 2024-02-18 – 2024-02-20 (×9): 500 mg via ORAL
  Filled 2024-02-17 (×9): qty 1

## 2024-02-17 MED ORDER — IOHEXOL 350 MG/ML SOLN
75.0000 mL | Freq: Once | INTRAVENOUS | Status: AC | PRN
  Administered 2024-02-17: 75 mL via INTRAVENOUS

## 2024-02-17 MED ORDER — FENTANYL CITRATE PF 50 MCG/ML IJ SOSY
PREFILLED_SYRINGE | INTRAMUSCULAR | Status: AC
  Administered 2024-02-17: 100 ug via INTRAVENOUS
  Filled 2024-02-17: qty 2

## 2024-02-17 MED ORDER — FENTANYL CITRATE PF 50 MCG/ML IJ SOSY
PREFILLED_SYRINGE | INTRAMUSCULAR | Status: AC
  Filled 2024-02-17: qty 2

## 2024-02-17 NOTE — Progress Notes (Signed)
   02/17/24 2115  Spiritual Encounters  Type of Visit Initial  Care provided to: Pt and family  Conversation partners present during encounter Nurse  Referral source Trauma page  Reason for visit Trauma  OnCall Visit Yes  Interventions  Spiritual Care Interventions Made Established relationship of care and support;Compassionate presence;Reflective listening    Chaplain responded to Level 2 page. Family members present. Pt in good spirits, and understands chaplain remains available through the night.
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
## 2024-02-17 NOTE — Progress Notes (Signed)
 Orthopedic Tech Progress Note Patient Details:  Kevin [name] [birth year] [id number]  Ortho Devices Type of Ortho Device: Long leg splint, Cotton web roll, Ace wrap Ortho Device/Splint Location: RLE Ortho Device/Splint Interventions: Ordered, Application   Post Interventions Patient Tolerated: Well  Kevin [name] OTR/L 02/17/2024, 11:22 PM
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
## 2024-02-18 ENCOUNTER — Inpatient Hospital Stay (HOSPITAL_COMMUNITY)

## 2024-02-18 ENCOUNTER — Encounter (HOSPITAL_COMMUNITY): Payer: Self-pay | Admitting: Orthopedic Surgery

## 2024-02-18 ENCOUNTER — Inpatient Hospital Stay (HOSPITAL_COMMUNITY): Admitting: Anesthesiology

## 2024-02-18 ENCOUNTER — Other Ambulatory Visit (HOSPITAL_COMMUNITY): Payer: Self-pay

## 2024-02-18 ENCOUNTER — Encounter (HOSPITAL_COMMUNITY)
Admission: EM | Disposition: A | Discharge status: Home Health | Payer: Self-pay | Source: Home / Self Care | Attending: Orthopedic Surgery

## 2024-02-18 DIAGNOSIS — S82251A Displaced comminuted fracture of shaft of right tibia, initial encounter for closed fracture: Principal | ICD-10-CM

## 2024-02-18 DIAGNOSIS — S82201A Unspecified fracture of shaft of right tibia, initial encounter for closed fracture: Secondary | ICD-10-CM

## 2024-02-18 HISTORY — PX: TIBIA IM NAIL INSERTION: SHX2516

## 2024-02-18 LAB — HIV ANTIBODY (ROUTINE TESTING W REFLEX): HIV Screen 4th Generation wRfx: NONREACTIVE

## 2024-02-18 LAB — CBG MONITORING, ED: Glucose-Capillary: 129 mg/dL — ABNORMAL HIGH (ref 70–99)

## 2024-02-18 LAB — GLUCOSE, CAPILLARY: Glucose-Capillary: 187 mg/dL — ABNORMAL HIGH (ref 70–99)

## 2024-02-18 LAB — ABO/RH

## 2024-02-18 LAB — TYPE AND SCREEN

## 2024-02-18 SURGERY — INSERTION, INTRAMEDULLARY ROD, TIBIA
Anesthesia: General | Site: Leg Lower | Laterality: Right

## 2024-02-18 MED ORDER — SUGAMMADEX SODIUM 200 MG/2ML IV SOLN
INTRAVENOUS | Status: DC | PRN
  Administered 2024-02-18: 200 mg via INTRAVENOUS

## 2024-02-18 MED ORDER — HYDROMORPHONE HCL 1 MG/ML IJ SOLN: 0.5000 mg | INTRAMUSCULAR | Status: AC | PRN

## 2024-02-18 MED ORDER — TRANEXAMIC ACID-NACL 1000-0.7 MG/100ML-% IV SOLN
INTRAVENOUS | Status: AC
  Filled 2024-02-18: qty 100

## 2024-02-18 MED ORDER — CELECOXIB 200 MG PO CAPS: 200.0000 mg | ORAL_CAPSULE | Freq: Once | ORAL | Status: DC

## 2024-02-18 MED ORDER — CHLORHEXIDINE GLUCONATE 0.12 % MT SOLN: 15.0000 mL | Freq: Once | OROMUCOSAL | Status: AC

## 2024-02-18 MED ORDER — DEXAMETHASONE SODIUM PHOSPHATE 10 MG/ML IJ SOLN
INTRAMUSCULAR | Status: AC
  Filled 2024-02-18: qty 1

## 2024-02-18 MED ORDER — ASPIRIN 81 MG PO CHEW
81.0000 mg | CHEWABLE_TABLET | Freq: Two times a day (BID) | ORAL | Status: DC
  Administered 2024-02-19 – 2024-02-20 (×3): 81 mg via ORAL
  Filled 2024-02-18 (×3): qty 1

## 2024-02-18 MED ORDER — OXYCODONE HCL 5 MG/5ML PO SOLN: 5.0000 mg | Freq: Once | ORAL | Status: DC | PRN

## 2024-02-18 MED ORDER — CEFAZOLIN SODIUM-DEXTROSE 2-4 GM/100ML-% IV SOLN
INTRAVENOUS | Status: AC
  Filled 2024-02-18: qty 100

## 2024-02-18 MED ORDER — PHENYLEPHRINE HCL-NACL 20-0.9 MG/250ML-% IV SOLN
INTRAVENOUS | Status: DC | PRN
  Administered 2024-02-18: 50 ug/min via INTRAVENOUS

## 2024-02-18 MED ORDER — LIDOCAINE 2% (20 MG/ML) 5 ML SYRINGE
INTRAMUSCULAR | Status: DC | PRN
Start: 2024-02-18 — End: 2024-02-18
  Administered 2024-02-18: 50 mg via INTRAVENOUS

## 2024-02-18 MED ORDER — ROCURONIUM BROMIDE 10 MG/ML (PF) SYRINGE
PREFILLED_SYRINGE | INTRAVENOUS | Status: AC
  Filled 2024-02-18: qty 10

## 2024-02-18 MED ORDER — INSULIN ASPART 100 UNIT/ML IJ SOLN: 0.0000 [IU] | INTRAMUSCULAR | Status: DC | PRN

## 2024-02-18 MED ORDER — ROCURONIUM BROMIDE 10 MG/ML (PF) SYRINGE
PREFILLED_SYRINGE | INTRAVENOUS | Status: DC | PRN
  Administered 2024-02-18: 50 mg via INTRAVENOUS

## 2024-02-18 MED ORDER — FLECAINIDE ACETATE 50 MG PO TABS
150.0000 mg | ORAL_TABLET | Freq: Two times a day (BID) | ORAL | Status: DC
  Administered 2024-02-18 – 2024-02-20 (×4): 150 mg via ORAL
  Filled 2024-02-18 (×5): qty 1

## 2024-02-18 MED ORDER — METHOCARBAMOL 500 MG PO TABS
500.0000 mg | ORAL_TABLET | Freq: Four times a day (QID) | ORAL | 0 refills | Status: AC
Start: 2024-02-18 — End: 2024-02-28
  Filled 2024-02-18: qty 40, 10d supply, fill #0

## 2024-02-18 MED ORDER — CHLORHEXIDINE GLUCONATE 0.12 % MT SOLN
OROMUCOSAL | Status: AC
  Administered 2024-02-18: 15 mL via OROMUCOSAL
  Filled 2024-02-18: qty 15

## 2024-02-18 MED ORDER — PHENYLEPHRINE 80 MCG/ML (10ML) SYRINGE FOR IV PUSH (FOR BLOOD PRESSURE SUPPORT)
PREFILLED_SYRINGE | INTRAVENOUS | Status: AC
  Filled 2024-02-18: qty 10

## 2024-02-18 MED ORDER — FENTANYL CITRATE (PF) 100 MCG/2ML IJ SOLN
25.0000 ug | INTRAMUSCULAR | Status: DC | PRN
  Administered 2024-02-18 (×3): 50 ug via INTRAVENOUS

## 2024-02-18 MED ORDER — CEFAZOLIN SODIUM-DEXTROSE 2-4 GM/100ML-% IV SOLN
2.0000 g | Freq: Once | INTRAVENOUS | Status: AC
  Administered 2024-02-18: 2 g via INTRAVENOUS

## 2024-02-18 MED ORDER — ONDANSETRON HCL 4 MG/2ML IJ SOLN
INTRAMUSCULAR | Status: DC | PRN
  Administered 2024-02-18: 4 mg via INTRAVENOUS

## 2024-02-18 MED ORDER — OXYCODONE HCL 5 MG PO TABS: 5.0000 mg | ORAL_TABLET | Freq: Once | ORAL | Status: DC | PRN

## 2024-02-18 MED ORDER — PROPOFOL 10 MG/ML IV BOLUS
INTRAVENOUS | Status: AC
  Filled 2024-02-18: qty 20

## 2024-02-18 MED ORDER — ACETAMINOPHEN 500 MG PO TABS: 1000.0000 mg | ORAL_TABLET | Freq: Once | ORAL | Status: AC

## 2024-02-18 MED ORDER — DIPHENHYDRAMINE HCL 50 MG/ML IJ SOLN
INTRAMUSCULAR | Status: DC | PRN
  Administered 2024-02-18: 12.5 mg via INTRAVENOUS

## 2024-02-18 MED ORDER — HYDROMORPHONE HCL 1 MG/ML IJ SOLN
INTRAMUSCULAR | Status: AC
  Filled 2024-02-18: qty 1

## 2024-02-18 MED ORDER — ALLOPURINOL 100 MG PO TABS
50.0000 mg | ORAL_TABLET | Freq: Every day | ORAL | Status: DC
  Administered 2024-02-19 – 2024-02-20 (×2): 50 mg via ORAL
  Filled 2024-02-18 (×2): qty 1

## 2024-02-18 MED ORDER — BACITRACIN ZINC 500 UNIT/GM EX OINT
TOPICAL_OINTMENT | CUTANEOUS | Status: AC
  Filled 2024-02-18: qty 28.35

## 2024-02-18 MED ORDER — CEFAZOLIN SODIUM-DEXTROSE 2-4 GM/100ML-% IV SOLN
2.0000 g | Freq: Four times a day (QID) | INTRAVENOUS | Status: AC
  Administered 2024-02-18 – 2024-02-19 (×3): 2 g via INTRAVENOUS
  Filled 2024-02-18 (×3): qty 100

## 2024-02-18 MED ORDER — LIDOCAINE 2% (20 MG/ML) 5 ML SYRINGE
INTRAMUSCULAR | Status: AC
  Filled 2024-02-18: qty 5

## 2024-02-18 MED ORDER — ACETAMINOPHEN 500 MG PO TABS
ORAL_TABLET | ORAL | Status: AC
  Administered 2024-02-18: 1000 mg via ORAL
  Filled 2024-02-18: qty 2

## 2024-02-18 MED ORDER — METOPROLOL TARTRATE 25 MG PO TABS
25.0000 mg | ORAL_TABLET | Freq: Every day | ORAL | Status: DC
  Administered 2024-02-19 – 2024-02-20 (×2): 25 mg via ORAL
  Filled 2024-02-18 (×2): qty 1

## 2024-02-18 MED ORDER — SODIUM CHLORIDE 0.45 % IV SOLN: INTRAVENOUS | Status: AC

## 2024-02-18 MED ORDER — DEXMEDETOMIDINE HCL IN NACL 200 MCG/50ML IV SOLN
INTRAVENOUS | Status: DC | PRN
  Administered 2024-02-18: 12 ug via INTRAVENOUS

## 2024-02-18 MED ORDER — ASPIRIN 81 MG PO CHEW
81.0000 mg | CHEWABLE_TABLET | Freq: Two times a day (BID) | ORAL | 0 refills | Status: AC
  Filled 2024-02-18: qty 84, 42d supply, fill #0

## 2024-02-18 MED ORDER — FENTANYL CITRATE (PF) 100 MCG/2ML IJ SOLN
INTRAMUSCULAR | Status: AC
  Filled 2024-02-18: qty 2

## 2024-02-18 MED ORDER — VANCOMYCIN HCL 1000 MG IV SOLR
INTRAVENOUS | Status: DC | PRN
  Administered 2024-02-18: 1000 mg via TOPICAL

## 2024-02-18 MED ORDER — DEXAMETHASONE SODIUM PHOSPHATE 10 MG/ML IJ SOLN
INTRAMUSCULAR | Status: DC | PRN
  Administered 2024-02-18: 10 mg via INTRAVENOUS

## 2024-02-18 MED ORDER — AMISULPRIDE (ANTIEMETIC) 5 MG/2ML IV SOLN: 10.0000 mg | Freq: Once | INTRAVENOUS | Status: DC | PRN

## 2024-02-18 MED ORDER — SCOPOLAMINE 1 MG/3DAYS TD PT72
MEDICATED_PATCH | TRANSDERMAL | Status: AC
  Filled 2024-02-18: qty 1

## 2024-02-18 MED ORDER — VANCOMYCIN HCL 1000 MG IV SOLR
INTRAVENOUS | Status: AC
  Filled 2024-02-18: qty 20

## 2024-02-18 MED ORDER — MIDAZOLAM HCL 2 MG/2ML IJ SOLN
INTRAMUSCULAR | Status: DC | PRN
  Administered 2024-02-18: 2 mg via INTRAVENOUS

## 2024-02-18 MED ORDER — ORAL CARE MOUTH RINSE: 15.0000 mL | Freq: Once | OROMUCOSAL | Status: AC

## 2024-02-18 MED ORDER — SODIUM CHLORIDE 0.9 % IV SOLN: 12.5000 mg | INTRAVENOUS | Status: DC | PRN

## 2024-02-18 MED ORDER — HYDROMORPHONE HCL 1 MG/ML IJ SOLN: 0.2500 mg | INTRAMUSCULAR | Status: DC | PRN

## 2024-02-18 MED ORDER — MIDAZOLAM HCL 2 MG/2ML IJ SOLN
INTRAMUSCULAR | Status: AC
  Filled 2024-02-18: qty 2

## 2024-02-18 MED ORDER — ONDANSETRON HCL 4 MG/2ML IJ SOLN
INTRAMUSCULAR | Status: AC
  Filled 2024-02-18: qty 2

## 2024-02-18 MED ORDER — TRANEXAMIC ACID-NACL 1000-0.7 MG/100ML-% IV SOLN: 1000.0000 mg | Freq: Once | INTRAVENOUS | Status: DC

## 2024-02-18 MED ORDER — DIPHENHYDRAMINE HCL 50 MG/ML IJ SOLN: 25.0000 mg | Freq: Three times a day (TID) | INTRAMUSCULAR | Status: DC | PRN

## 2024-02-18 MED ORDER — PROPOFOL 10 MG/ML IV BOLUS
INTRAVENOUS | Status: DC | PRN
  Administered 2024-02-18: 200 mg via INTRAVENOUS

## 2024-02-18 MED ORDER — FENTANYL CITRATE (PF) 250 MCG/5ML IJ SOLN
INTRAMUSCULAR | Status: AC
  Filled 2024-02-18: qty 5

## 2024-02-18 MED ORDER — POLYETHYLENE GLYCOL 3350 17 GM/SCOOP PO POWD
17.0000 g | Freq: Every day | ORAL | 0 refills | Status: AC
Start: 2024-02-18 — End: 2024-03-03
  Filled 2024-02-18: qty 238, 14d supply, fill #0

## 2024-02-18 MED ORDER — ACETAMINOPHEN 500 MG PO TABS
1000.0000 mg | ORAL_TABLET | Freq: Three times a day (TID) | ORAL | 0 refills | Status: AC
  Filled 2024-02-18: qty 84, 14d supply, fill #0

## 2024-02-18 MED ORDER — 0.9 % SODIUM CHLORIDE (POUR BTL) OPTIME
TOPICAL | Status: DC | PRN
  Administered 2024-02-18: 1000 mL

## 2024-02-18 MED ORDER — SENNOSIDES-DOCUSATE SODIUM 8.6-50 MG PO TABS
1.0000 | ORAL_TABLET | Freq: Two times a day (BID) | ORAL | 0 refills | Status: AC
Start: 2024-02-18 — End: 2024-03-03
  Filled 2024-02-18: qty 28, 14d supply, fill #0

## 2024-02-18 MED ORDER — KETOROLAC TROMETHAMINE 15 MG/ML IJ SOLN
15.0000 mg | Freq: Three times a day (TID) | INTRAMUSCULAR | Status: AC
  Administered 2024-02-18 – 2024-02-19 (×5): 15 mg via INTRAVENOUS
  Filled 2024-02-18 (×5): qty 1

## 2024-02-18 MED ORDER — LACTATED RINGERS IV SOLN: INTRAVENOUS | Status: DC

## 2024-02-18 MED ORDER — FENTANYL CITRATE (PF) 250 MCG/5ML IJ SOLN
INTRAMUSCULAR | Status: DC | PRN
  Administered 2024-02-18: 50 ug via INTRAVENOUS
  Administered 2024-02-18 (×2): 100 ug via INTRAVENOUS

## 2024-02-18 MED ORDER — OXYCODONE HCL 5 MG PO TABS
5.0000 mg | ORAL_TABLET | ORAL | 0 refills | Status: DC | PRN
Start: 2024-02-18 — End: 2024-02-25
  Filled 2024-02-18: qty 40, 4d supply, fill #0

## 2024-02-18 SURGICAL SUPPLY — 50 items
BAG COUNTER SPONGE SURGICOUNT (BAG) ×1 IMPLANT
BIT DRILL LONG 4.0 (BIT) IMPLANT
BIT DRILL SHORT 4.0 (BIT) IMPLANT
BLADE SURG 10 STRL SS (BLADE) ×2 IMPLANT
BNDG COHESIVE 4X5 TAN STRL LF (GAUZE/BANDAGES/DRESSINGS) ×1 IMPLANT
BNDG ELASTIC 4X5.8 VLCR STR LF (GAUZE/BANDAGES/DRESSINGS) ×1 IMPLANT
BNDG ELASTIC 6INX 5YD STR LF (GAUZE/BANDAGES/DRESSINGS) ×1 IMPLANT
BNDG GAUZE DERMACEA FLUFF 4 (GAUZE/BANDAGES/DRESSINGS) ×1 IMPLANT
BRUSH SCRUB EZ PLAIN DRY (MISCELLANEOUS) ×2 IMPLANT
CHLORAPREP W/TINT 26 (MISCELLANEOUS) ×1 IMPLANT
COVER SURGICAL LIGHT HANDLE (MISCELLANEOUS) ×2 IMPLANT
DRAPE C-ARM 42X72 X-RAY (DRAPES) ×1 IMPLANT
DRAPE C-ARMOR (DRAPES) ×1 IMPLANT
DRAPE HALF SHEET 40X57 (DRAPES) ×2 IMPLANT
DRAPE IMP U-DRAPE 54X76 (DRAPES) ×2 IMPLANT
DRAPE INCISE IOBAN 66X45 STRL (DRAPES) IMPLANT
DRAPE SURG ORHT 6 SPLT 77X108 (DRAPES) ×2 IMPLANT
DRAPE U-SHAPE 47X51 STRL (DRAPES) ×1 IMPLANT
DRILL BIT LONG 4.0 (BIT) ×2 IMPLANT
DRSG ADAPTIC 3X8 NADH LF (GAUZE/BANDAGES/DRESSINGS) ×1 IMPLANT
DRSG TEGADERM 4X4.75 (GAUZE/BANDAGES/DRESSINGS) IMPLANT
ELECT REM PT RETURN 9FT ADLT (ELECTROSURGICAL) ×1 IMPLANT
ELECTRODE REM PT RTRN 9FT ADLT (ELECTROSURGICAL) ×1 IMPLANT
GAUZE SPONGE 4X4 12PLY STRL (GAUZE/BANDAGES/DRESSINGS) ×1 IMPLANT
GAUZE XEROFORM 5X9 LF (GAUZE/BANDAGES/DRESSINGS) IMPLANT
GLOVE BIO SURGEON STRL SZ 6.5 (GLOVE) ×3 IMPLANT
GLOVE BIO SURGEON STRL SZ7.5 (GLOVE) ×4 IMPLANT
GLOVE BIOGEL PI IND STRL 6.5 (GLOVE) ×1 IMPLANT
GLOVE BIOGEL PI IND STRL 7.5 (GLOVE) ×1 IMPLANT
GOWN STRL REUS W/ TWL LRG LVL3 (GOWN DISPOSABLE) ×2 IMPLANT
GUIDE PIN 3.2X343 (PIN) ×1 IMPLANT
GUIDE ROD 3.0 (MISCELLANEOUS) ×1 IMPLANT
KIT BASIN OR (CUSTOM PROCEDURE TRAY) ×1 IMPLANT
KIT TURNOVER KIT B (KITS) ×1 IMPLANT
NAIL TIBIAL10X35 (Nail) IMPLANT
PACK TOTAL JOINT (CUSTOM PROCEDURE TRAY) ×1 IMPLANT
PAD ARMBOARD POSITIONER FOAM (MISCELLANEOUS) ×2 IMPLANT
PIN GUIDE 3.2X343MM (PIN) IMPLANT
ROD GUIDE 3.0 (MISCELLANEOUS) IMPLANT
SCREW TRIGEN LOW PROF 5.0X35 (Screw) IMPLANT
SCREW TRIGEN LOW PROF 5.0X47.5 (Screw) IMPLANT
SCREW TRIGEN LOW PROF 5.0X62.5 (Screw) IMPLANT
SCREW TRIGEN LOW PROF 5.0X70 (Screw) IMPLANT
STAPLER VISISTAT 35W (STAPLE) ×1 IMPLANT
SUT MNCRL AB 3-0 PS2 18 (SUTURE) ×1 IMPLANT
SUT VIC AB 0 CT1 27XBRD ANBCTR (SUTURE) IMPLANT
SUT VIC AB 2-0 CT1 TAPERPNT 27 (SUTURE) IMPLANT
TOWEL GREEN STERILE (TOWEL DISPOSABLE) ×2 IMPLANT
TOWEL GREEN STERILE FF (TOWEL DISPOSABLE) ×1 IMPLANT
YANKAUER SUCT BULB TIP NO VENT (SUCTIONS) IMPLANT

## 2024-02-18 NOTE — Transfer of Care (Signed)
 Immediate Anesthesia Transfer of Care Note  Patient: Kevin [name]  Procedure(s) Performed: INSERTION OF TIBIAL INTRAMEDULLARY ROD (Right: Leg Lower)  Patient Location: PACU  Anesthesia Type:General  Level of Consciousness: awake, alert , and oriented  Airway & Oxygen Therapy: Patient Spontanous Breathing and Patient connected to face mask oxygen  Post-op Assessment: Report given to RN and Post -op Vital signs reviewed and stable  Post vital signs: Reviewed and stable  Last Vitals:  Vitals Value Taken Time  BP 135/99 02/18/24 1137  Temp 98.6   Pulse 81 02/18/24 1143  Resp 14 02/18/24 1143  SpO2 100 % 02/18/24 1143  Vitals shown include unfiled device data.  Last Pain:  Vitals:   02/18/24 0805  TempSrc: Oral  PainSc:       Patients Stated Pain Goal: 0 (02/17/24 2020)  Complications: There were no known notable events for this encounter.

## 2024-02-18 NOTE — Progress Notes (Signed)
 PT Cancellation Note  Patient Details Name: Kevin [name] MRN: [id number] DOB: 1965-12-01   Cancelled Treatment:    Reason Eval/Treat Not Completed: Patient at procedure or test/unavailable (Pt scheduled for ORIF this morning. Will follow up tomorrow after surgery.)   Taavi Hoose 02/18/2024, 8:01 AM

## 2024-02-18 NOTE — Discharge Instructions (Signed)
 Orthopedic Surgery Discharge Instructions  Patient name: Kevin [name]  Fracture: right tibia fracture  Procedure Performed: right tibia fracture open reduction internal fixation with intramedullary rod Date of Surgery: 02/18/2024 Surgeon: Colette Davies, MD  Activity: You are allowed to put as much weight on your leg as you would like. You can walk as much as you would like. You can perform household activities such as cleaning dishes, doing laundry, vacuuming, etc.  Incision Care: Your incision sites have dressings over them. The dressings should remain in place and dry at all times for a total of one week after surgery. After one week, you can remove the dressings. Underneath the dressings, you will find skin staples and sutures. You should leave these staples and sutures in place. They will be taken out in the office when the wound has healed. Do not pick, rub, or scrub at them. Do not put cream or lotion over the surgical area. After one week and once the dressing is off, it is okay to let soap and water run over your incision. Again, do not pick, scrub, or rub at the staples or sutures when bathing. Do not submerge (e.g., take a bath, swim, go in a hot tub, etc.) until six weeks after surgery. There may be some bloody drainage from the incision into the dressing after surgery. This is normal. You do not need to replace the dressing. Continue to leave it in place for the one week as instructed above. Should the dressing become saturated with blood or drainage, please call the office for further instructions.   Medications: You have been prescribed oxycodone . This is a narcotic pain medication and should only be taken as prescribed. You should not drink alcohol or operate heavy machinery (including driving) while taking this medication. The oxycodone  can cause constipation as a side effect. For that reason, you have been prescribed senna and miralax . These are both laxatives. You do not need to  take this medication if you develop diarrhea. Should you remain constipated even while taking the senna and miralax , please use the miralax  twice daily. Tylenol  has been prescribed to be taken every 8 hours, which will give you additional pain relief. Robaxin  is a muscle relaxer which has been prescribed to you to help with muscle spasms.   You have been prescribed aspirin  as a blood thinner. This medication is to be taken to prevent blood clots. Take 81 milligrams twice daily. You should refrain from using other blood thinners (warfarin, apixaban, plavix, xarelto, etc.) while using the aspirin . You will need to take this medication for a total of 6 weeks after your surgery.   You should not use over-the-counter NSAIDs (ibuprofen, Aleve, Celebrex , naproxen, meloxicam, etc.) for pain relief because aspirin  is a similar medication. There can be side effects including but not limited to kidney injury and ulcers if you take these type of medications with the aspirin .  In order to set expectations for opioid prescriptions, you will only be prescribed opioids for a total of six weeks after surgery and, at two-weeks after surgery, your opioid prescription will start to tapered (decreased dosage and number of pills). If you have ongoing need for opioid medication six weeks after surgery, you will be referred to pain management. If you are already established with a provider that is giving you opioid medications, you should schedule an appointment with them for six weeks after surgery if you feel you are going to need another prescription. State law only allows for opioid prescriptions  one week at a time. If you are running out of opioid medication near the end of the week, please call the office during business hours before running out so I can send you another prescription.   Driving: You should not drive while taking narcotic pain medications. You should start getting back to driving slowly and you may want to try  driving in a parking lot before doing anything more.   Diet: You are safe to resume your regular diet after surgery.   Reasons to Call the Office After Surgery: You should feel free to call the office with any concerns or questions you have in the post-operative period, but you should definitely notify the office if you develop: -shortness of breath, chest pain, or trouble breathing -excessive bleeding, drainage, redness, or swelling around the surgical site -fevers, chills, or pain that is getting worse with each passing day -persistent nausea or vomiting -new weakness in the right leg, new or worsening numbness or tingling in the right leg -other concerns about your surgery  Follow Up Appointments: You have an appointment with Dr. Sulema Endo on 03/08/2024 at 8:45am. Please arrive on time. The office location and phone number are listed below.   Office Information:  -Colette Davies, MD -Phone number: [phone] -Address: [address]       [place], [place] [address]
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
## 2024-02-18 NOTE — Brief Op Note (Signed)
 02/18/2024  11:57 AM  PATIENT:  Geofm Killer  58 y.o. male  PRE-OPERATIVE DIAGNOSIS:  RIGHT TIBIA FRACTURE  POST-OPERATIVE DIAGNOSIS:  RIGHT TIBIA FRACTURE  PROCEDURE:  Procedure(s): INSERTION OF TIBIAL INTRAMEDULLARY ROD (Right)  SURGEON:  Surgeons and Role:    Diedra Fowler, MD - Primary  PHYSICIAN ASSISTANT: none  ASSISTANTS: none   ANESTHESIA:   general  EBL:  100cc  BLOOD ADMINISTERED:none  DRAINS: none   LOCAL MEDICATIONS USED:  NONE  SPECIMEN:  No Specimen  DISPOSITION OF SPECIMEN:  N/A  COUNTS:  YES  TOURNIQUET: NONE  DICTATION: .Note written in EPIC  PLAN OF CARE: Admit to inpatient   PATIENT DISPOSITION:  PACU - hemodynamically stable.   Delay start of Pharmacological VTE agent (>24hrs) due to surgical blood loss or risk of bleeding: no
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
## 2024-02-18 NOTE — Anesthesia Preprocedure Evaluation (Addendum)
 Anesthesia Evaluation  Patient identified by MRN, date of birth, ID band Patient awake    Reviewed: Allergy & Precautions, NPO status , Patient's Chart, lab work & pertinent test results, reviewed documented beta blocker date and time   History of Anesthesia Complications (+) history of anesthetic complications  Airway Mallampati: II  TM Distance: >3 FB Neck ROM: Full    Dental  (+) Dental Advisory Given, Teeth Intact   Pulmonary Current Smoker and Patient abstained from smoking.   Pulmonary exam normal        Cardiovascular Normal cardiovascular exam+ dysrhythmias Atrial Fibrillation + Valvular Problems/Murmurs    Hx bicuspid AV, no surgery currently indicated  '17 TTE - NORMAL LEFT VENTRICULAR SYSTOLIC FUNCTION WITH MILD LVH    NORMAL LA PRESSURES WITH DIASTOLIC DYSFUNCTION    NORMAL RIGHT VENTRICULAR SYSTOLIC FUNCTION    VALVULAR REGURGITATION: MILD AR, TRIVIAL MR, TRIVIAL PR, TRIVIAL TR    NO VALVULAR STENOSIS    MODERATELY DILATED ASCENDING AORTA  4.6cm  '16 TTE mentioned functionally bicuspid AV, +PFO    Neuro/Psych negative neurological ROS  negative psych ROS   GI/Hepatic Neg liver ROS,GERD  Medicated and Controlled,,  Endo/Other  diabetes    Renal/GU Renal InsufficiencyRenal disease     Musculoskeletal  (+) Arthritis ,    Abdominal   Peds  Hematology negative hematology ROS (+)   Anesthesia Other Findings Hx of uvula swelling postoperatively. States it happened after two separate procedures (1 being inguinal hernia repair, unsure of other). Swelling typically occurs ~24 hr postop, to the point that his uvula protrudes out of his mouth when coughing. Neither incident resulted in reintubation or coincided with other allergy symptoms.  Reproductive/Obstetrics                             Anesthesia Physical Anesthesia Plan  ASA: 2  Anesthesia Plan: General   Post-op Pain  Management: Tylenol  PO (pre-op)* and Celebrex  PO (pre-op)*   Induction: Intravenous  PONV Risk Score and Plan: 1 and Treatment may vary due to age or medical condition, Ondansetron , Dexamethasone  and Midazolam   Airway Management Planned: Oral ETT and Video Laryngoscope Planned  Additional Equipment: None  Intra-op Plan:   Post-operative Plan: Extubation in OR  Informed Consent: I have reviewed the patients History and Physical, chart, labs and discussed the procedure including the risks, benefits and alternatives for the proposed anesthesia with the patient or authorized representative who has indicated his/her understanding and acceptance.     Dental advisory given  Plan Discussed with: CRNA and Anesthesiologist  Anesthesia Plan Comments:        Anesthesia Quick Evaluation

## 2024-02-18 NOTE — Anesthesia Postprocedure Evaluation (Signed)
 Anesthesia Post Note  Patient: Oather Muilenburg  Procedure(s) Performed: INSERTION OF TIBIAL INTRAMEDULLARY ROD (Right: Leg Lower)     Patient location during evaluation: PACU Anesthesia Type: General Level of consciousness: awake and alert Pain management: pain level controlled Vital Signs Assessment: post-procedure vital signs reviewed and stable Respiratory status: spontaneous breathing, nonlabored ventilation, respiratory function stable and patient connected to nasal cannula oxygen Cardiovascular status: blood pressure returned to baseline and stable Postop Assessment: no apparent nausea or vomiting Anesthetic complications: no   There were no known notable events for this encounter.  Last Vitals:  Vitals:   02/18/24 1255 02/18/24 1316  BP: 105/65 (!) 121/91  Pulse: 81 75  Resp: (!) 9 17  Temp: 36.9 C 36.6 C  SpO2: 93% 98%                    Juventino Oppenheim
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
## 2024-02-19 ENCOUNTER — Other Ambulatory Visit: Payer: Self-pay

## 2024-02-19 LAB — BASIC METABOLIC PANEL WITH GFR
Anion gap: 10 (ref 5–15)
BUN: 13 mg/dL (ref 6–20)
CO2: 27 mmol/L (ref 22–32)
Calcium: 8.5 mg/dL — ABNORMAL LOW (ref 8.9–10.3)
Chloride: 100 mmol/L (ref 98–111)
Creatinine, Ser: 1.43 mg/dL — ABNORMAL HIGH (ref 0.61–1.24)
GFR, Estimated: 57 mL/min — ABNORMAL LOW (ref >60)
Glucose, Bld: 134 mg/dL — ABNORMAL HIGH (ref 70–99)
Potassium: 4.1 mmol/L (ref 3.5–5.1)
Sodium: 137 mmol/L (ref 135–145)

## 2024-02-19 LAB — CBC
HCT: 36.3 % — ABNORMAL LOW (ref 39.0–52.0)
Hemoglobin: 11.7 g/dL — ABNORMAL LOW (ref 13.0–17.0)
MCH: 30.1 pg (ref 26.0–34.0)
MCHC: 32.2 g/dL (ref 30.0–36.0)
MCV: 93.3 fL (ref 80.0–100.0)
Platelets: 232 10*3/uL (ref 150–400)
RBC: 3.89 MIL/uL — ABNORMAL LOW (ref 4.22–5.81)
RDW: 13.5 % (ref 11.5–15.5)
WBC: 11.8 10*3/uL — ABNORMAL HIGH (ref 4.0–10.5)
nRBC: 0 % (ref 0.0–0.2)

## 2024-02-19 MED ORDER — OXYCODONE HCL 5 MG PO TABS
2.5000 mg | ORAL_TABLET | ORAL | 0 refills | Status: AC | PRN
  Filled 2024-02-19: qty 15, 3d supply, fill #0

## 2024-02-19 MED ORDER — OXYCODONE HCL 5 MG PO TABS
2.5000 mg | ORAL_TABLET | ORAL | Status: DC | PRN
  Administered 2024-02-19 – 2024-02-20 (×2): 5 mg via ORAL
  Filled 2024-02-19 (×3): qty 1
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
## 2024-02-19 NOTE — Plan of Care (Signed)

## 2024-02-19 NOTE — Progress Notes (Signed)
 Orthopedic Surgery Progress Note   Assessment: Patient is a 58 y.o. male with right tibia fracture status post IMN   Plan: -Operative plans: complete -Diet: regular -DVT ppx: aspirin  81mg  BID -Antibiotics: ancef  x2 post-op doses -Weight bearing status: as tolerated -PT evaluate and treat -Pain control -Dispo: remain floor status  ___________________________________________________________________________  Subjective: No acute events overnight. Reports not having too much pain. States muscle relaxer is controlling his pain. Has been getting toradol  as well. Did not take any opioids overnight. Has not taken any this morning either. Denies paresthesias and numbness.    Physical Exam:  General: no acute distress, appears stated age Neurologic: alert, answering questions appropriately, following commands Respiratory: unlabored breathing on room air, symmetric chest rise Psychiatric: appropriate affect, normal cadence to speech  MSK:   -Right lower extremity  Dressings over leg c/d/I except distal most dressing with dime-sized amount of blood  No increased pain with active or passive stretch at the ankle, compartments soft and compressible EHL/TA/GSC intact Plantarflexes and dorsiflexes toes Sensation intact to light touch in sural, saphenous, tibial, deep peroneal, and superficial peroneal nerve distributions Foot warm and well perfused, palpable DP pulse   Patient name: Kevin [name] Patient MRN: [id number] Date: 02/19/24

## 2024-02-20 ENCOUNTER — Other Ambulatory Visit (HOSPITAL_COMMUNITY): Payer: Self-pay

## 2024-02-20 ENCOUNTER — Encounter (HOSPITAL_COMMUNITY): Payer: Self-pay | Admitting: Orthopedic Surgery

## 2024-02-20 MED ORDER — KETOROLAC TROMETHAMINE 10 MG PO TABS
10.0000 mg | ORAL_TABLET | Freq: Three times a day (TID) | ORAL | 0 refills | Status: AC | PRN
  Filled 2024-02-20: qty 10, 4d supply, fill #0

## 2024-02-20 MED ORDER — DOUBLE ANTIBIOTIC 500-10000 UNIT/GM EX OINT
TOPICAL_OINTMENT | Freq: Every day | CUTANEOUS | Status: DC
  Filled 2024-02-20: qty 28.4

## 2024-02-20 MED ORDER — ONDANSETRON HCL 4 MG PO TABS
4.0000 mg | ORAL_TABLET | Freq: Three times a day (TID) | ORAL | 0 refills | Status: AC | PRN
  Filled 2024-02-20: qty 20, 7d supply, fill #0
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
## 2024-02-20 NOTE — Progress Notes (Signed)
 Transition of Care [facility]) - Inpatient Brief Assessment   Patient Details  Name: Chanse Kagel MRN: [id number] Date of Birth: 1966-06-09  Transition of Care [facility]) CM/SW Contact:    Dane Dung, RN Phone Number: 02/20/2024, 10:57 AM   Clinical Narrative: CM met with the patient and fiance prior to discharge to home.  Patient plans to return home with his fiance today.  Patient was offered choice regarding home health agency and he did not have a preference.  I called [facility] and [facility] accepted patient for home health PT/OT.  HH orders placed and signed by the MD.  Documents faxed to the [facility] requested expedited shipping of RW and 3:1 to the home.  Wife is purchasing RW and 3:1 in the meantime for mobility in the home since delivery of [facility] ma take a few days to ship.  No other TOC needs at this time and bedside nursing is discharging the patient  home by car today.     Transition of Care Asessment: Insurance and Status: (P) Insurance coverage has been reviewed Patient has primary care physician: (P) Yes Home environment has been reviewed: (P) from home with fiance Prior level of function:: (P) Independent prior to [facility] Prior/Current Home Services: (P) No current home services Social Drivers of Health Review: (P) SDOH reviewed interventions complete Readmission risk has been reviewed: (P) Yes Transition of care needs: (P) transition of care needs identified, TOC will continue to follow

## 2024-02-20 NOTE — Progress Notes (Addendum)
 Orthopedic Surgery Progress Note   Assessment: Patient is a 58 y.o. male with right tibia fracture status post IMN   Plan: -Operative plans: complete -Diet: regular -DVT ppx: aspirin  81mg  BID -Weight bearing status: as tolerated -PT evaluate and treat -Pain control -Anticipate discharge to home today  Acute anemia from surgical blood loss -hemoglobin of 11.7 from 15.4 pre-operatively -patient asymptomatic and no further blood loss expected -will monitor for signs/symptoms of anemia  Chronic kidney disease, stage 3a -creatinine was 1.5 pre-operatively -on most recent check it was 1.43  ___________________________________________________________________________  Subjective: No acute events overnight. Walked the halls yesterday with PT. Not having much pain this morning.    Physical Exam:  General: no acute distress, appears stated age Neurologic: alert, answering questions appropriately, following commands Respiratory: unlabored breathing on room air, symmetric chest rise Psychiatric: appropriate affect, normal cadence to speech  MSK:   -Right lower extremity  Dressings over leg c/d/I  No increased pain with active or passive stretch at the ankle, compartments soft and compressible EHL/TA/GSC intact Plantarflexes and dorsiflexes toes Sensation intact to light touch in sural, saphenous, tibial, deep peroneal, and superficial peroneal nerve distributions Foot warm and well perfused, palpable DP pulse   Patient name: Kevin [name] Patient MRN: [id number] Date: 02/20/24

## 2024-02-28 ENCOUNTER — Telehealth: Payer: Self-pay | Admitting: Radiology

## 2024-02-28 DIAGNOSIS — M7989 Other specified soft tissue disorders: Secondary | ICD-10-CM

## 2024-02-28 NOTE — Telephone Encounter (Signed)
 HH Called states that patients foot is very cold to touch and that he is having pain in the area of the achilles with flexing his foot. Please advise.

## 2024-02-28 NOTE — Telephone Encounter (Signed)
I called and advised patient of the message from Dr. Christell Constant

## 2024-02-29 ENCOUNTER — Ambulatory Visit (HOSPITAL_COMMUNITY)
Admission: RE | Admit: 2024-02-29 | Discharge: 2024-02-29 | Disposition: A | Discharge status: Home / Self Care | Source: Ambulatory Visit | Attending: Orthopedic Surgery | Admitting: Orthopedic Surgery

## 2024-02-29 ENCOUNTER — Telehealth: Payer: Self-pay | Admitting: Orthopedic Surgery

## 2024-02-29 DIAGNOSIS — M7989 Other specified soft tissue disorders: Secondary | ICD-10-CM

## 2024-02-29 NOTE — Telephone Encounter (Signed)
 Patient called and said that after dr. Sulema Endo told him to elevate his foot, the temperature of the foot went down and he thinks he can wait til the 8th for his post op appointment. He just wants to know if that was ok. CB#[phone]

## 2024-02-29 NOTE — Telephone Encounter (Signed)
 I called and spoke with patient and advised that he needs to have the doppler study as soon as he can so we can r/o DVT or other vascular issue. He said ok that he would call back and get scheduled

## 2024-03-08 ENCOUNTER — Ambulatory Visit: Admitting: Orthopedic Surgery

## 2024-03-08 ENCOUNTER — Other Ambulatory Visit (INDEPENDENT_AMBULATORY_CARE_PROVIDER_SITE_OTHER): Payer: Self-pay

## 2024-03-08 DIAGNOSIS — M7989 Other specified soft tissue disorders: Secondary | ICD-10-CM

## 2024-03-08 MED ORDER — OXYCODONE HCL 5 MG PO TABS: 2.5000 mg | ORAL_TABLET | Freq: Four times a day (QID) | ORAL | 0 refills | Status: AC | PRN

## 2024-03-08 MED ORDER — CYCLOBENZAPRINE HCL 10 MG PO TABS
10.0000 mg | ORAL_TABLET | Freq: Three times a day (TID) | ORAL | 0 refills | Status: AC | PRN
Start: 2024-03-08 — End: ?

## 2024-03-08 NOTE — Progress Notes (Signed)
 Orthopedic Surgery Post-operative Office Visit  Procedure: right tibia intramedullary rodding Date of Surgery: 02/18/2024  Assessment: Patient is a 58 y.o. who is healing as expected after surgery   Plan: -Operative plans complete -Weight bearing as tolerated right lower extremity -DVT ppx: ASA 81mg  BID x4 more weeks -Staples removed today in office -Okay to let soap/water run over incisions but do not submerge -Pain management: weaning oxycodone , Flexeril -Will plan to start outpatient PT after our next visit -Return to office in 4 weeks, x-rays needed at next visit: right tibia AP/lateral  ___________________________________________________________________________   Subjective: Patient had significant pain after discharge from the hospital with swelling around the distal one third of the tibia and ankle.  Swelling and pain has gotten significantly better with time.  He has been ambulating with a walker.  He said he walks most of the day.  He said the other day he walked around the mall.  After he does a lot of walking, he does notice pain at night.  He also feels that his leg swells as he does a lot of walking.  He has decreased his use of medication for pain control.  He is taking oxycodone  at night after he has been active.  He will also use Flexeril at that time.  He does not take any other medications during the day for pain.  He has not noticed any redness or drainage around his incisions.  Objective:  General: no acute distress, appropriate affect Neurologic: alert, answering questions appropriately, following commands Respiratory: unlabored breathing on room air Skin: incisions are well approximated with no erythema, induration, active/expressible drainage  MSK (RLE): EHL/TA/GSC intact, sensation intact to light touch in sural/saphenous/deep peroneal/superficial peroneal/tibial nerve distributions, foot warm and well-perfused, palpable DP pulse, extensor mechanism  intact  Imaging: X-rays of the right tibia from 03/08/2024 were independently reviewed and interpreted, showing a comminuted midshaft tibia fracture with intramedullary rod in place.  No lucency seen around the proximal or distal interlocking screws.  None of the interlocking screws backed out.  There is a segmental comminuted fibula fracture as well.  Fracture reduction and alignment has been maintained since immediate postoperative films on 02/18/2024.  No callus formation seen.  No new fractures seen.   Patient name: Kevin [name] Patient MRN: [id number] Date of visit: 03/08/24

## 2024-04-11 ENCOUNTER — Other Ambulatory Visit (INDEPENDENT_AMBULATORY_CARE_PROVIDER_SITE_OTHER)

## 2024-04-11 ENCOUNTER — Ambulatory Visit (INDEPENDENT_AMBULATORY_CARE_PROVIDER_SITE_OTHER): Admitting: Orthopedic Surgery

## 2024-04-11 DIAGNOSIS — M7989 Other specified soft tissue disorders: Secondary | ICD-10-CM

## 2024-04-11 NOTE — Progress Notes (Signed)
 Orthopedic Surgery Post-operative Office Visit   Procedure: right tibia intramedullary rodding Date of Surgery: 02/18/2024 (~6 weeks post-op)   Assessment: Patient is a 58 y.o. who is healing as expected after surgery     Plan: -Operative plans complete -Weight bearing as tolerated right lower extremity -DVT ppx: none -Okay to submerge wounds at this point -Pain management: Tylenol  as needed -Return to office in 6 weeks, x-rays needed at next visit: right tibia AP/lateral   ___________________________________________________________________________     Subjective: Patient has noticed improvement since he was last seen in the office.  He still having some pain around the distal third of his leg particularly with pivoting motion through the leg.  He is not taking any medication for pain.  He is ambulating without any assistive devices.  He has gotten back to work.  He said at work he does a lot of physical tasks and has been able to do them.  Some days, he has more pain than others.  Overall though, he feels that he is improving in terms of pain and his ability to function.   Objective:   General: no acute distress, appropriate affect Neurologic: alert, answering questions appropriately, following commands Respiratory: unlabored breathing on room air Skin: incisions are well healed with no erythema, induration, active/expressible drainage   MSK (RLE): Knee ROM from 0-100, ankle ROM from neutral to 30 degrees plantarflexion, EHL/TA/GSC intact, sensation intact to light touch in sural/saphenous/deep peroneal/superficial peroneal/tibial nerve distributions, foot warm and well-perfused, palpable DP pulse   Imaging: X-rays of the right tibia from 04/11/2024 were independently reviewed and interpreted, showing a segmental fibula fracture with comminution at both fracture sites.  No callus formation seen.  There is a comminuted distal third tibia fracture which is unchanged in alignment since  prior films on 03/08/2024. No callus formation seen at the tibia fracture. There is an intramedullary rod in place.  No lucency seen around any of the interlocking screws.     Patient name: Kevin [name] Patient MRN: [id number] Date of visit: 04/11/24

## 2024-05-23 ENCOUNTER — Other Ambulatory Visit (INDEPENDENT_AMBULATORY_CARE_PROVIDER_SITE_OTHER)

## 2024-05-23 ENCOUNTER — Ambulatory Visit (INDEPENDENT_AMBULATORY_CARE_PROVIDER_SITE_OTHER): Admitting: Orthopedic Surgery

## 2024-05-23 DIAGNOSIS — M7989 Other specified soft tissue disorders: Secondary | ICD-10-CM | POA: Diagnosis not present

## 2024-05-23 NOTE — Progress Notes (Signed)
 Orthopedic Surgery Post-operative Office Visit   Procedure: right tibia intramedullary rodding Date of Surgery: 02/18/2024 (~3 months post-op)   Assessment: Patient is a 58 y.o. who is improving after his right comminuted tibia fracture and associated fibula fractures     Plan: -Operative plans complete -Weight bearing as tolerated right lower extremity, activity as tolerated -DVT ppx: none -Okay to submerge wounds at this point -Pain management: Tylenol  as needed -Should continue to do his knee exercises, also discussed Achilles tendon stretching -Return to office in 3 months, x-rays needed at next visit: right tibia AP/lateral   ___________________________________________________________________________     Subjective: Patient has been doing well since he was last seen in the office.  He has noticed improvement in his symptoms.  He said most days he does not have any pain in his right leg.  However, he will have days where he has pain.  He usually can tell first thing in the morning whether it is going to be a day where he has pain or he does not.  There is no any particular activities or motions that bring on the pain.  He said it just happens first thing in morning will persist throughout the day but then usually by the next day he is not having any pain in the leg.  He sometimes has knee pain but has been doing strengthening of the quadriceps muscles and flexion exercises that have been helping.  His pain that is more bothersome is over the distal aspect of the right leg posteriorly near the musculotendinous junction of the Achilles.  He has gotten back to golf.  He is playing as much as he would like.  He said he plays every Friday and Saturday.  He has not noticed any functional limitations at this point.   Objective:   General: no acute distress, appropriate affect Neurologic: alert, answering questions appropriately, following commands Respiratory: unlabored breathing on room  air Skin: incisions are well healed    MSK (RLE): ambulates without assistive devices, nonantalgic gait, knee ROM from 0-100, ankle ROM from neutral to 30 degrees plantarflexion, EHL/TA/GSC intact, sensation intact to light touch in sural/saphenous/deep peroneal/superficial peroneal/tibial nerve distributions, foot warm and well-perfused, palpable DP pulse   Imaging: XRs of the right tibia from 05/23/2024 were reviewed and interpreted, showing early callus formation medially at both the proximal and distal fibula fractures.  On the lateral view, there appears to be bridging bone mass anteriorly at the tibia and there is early callus formation posteriorly.  Fracture alignment appears unchanged since prior films on 04/11/2024.  No new fracture seen.  No lucency seen around any of the interlocking screws.     Patient name: Kevin [name] Patient MRN: [id number] Date of visit: 05/23/24

## 2024-08-23 ENCOUNTER — Other Ambulatory Visit (INDEPENDENT_AMBULATORY_CARE_PROVIDER_SITE_OTHER)

## 2024-08-23 ENCOUNTER — Ambulatory Visit: Admitting: Orthopedic Surgery

## 2024-08-23 DIAGNOSIS — M7989 Other specified soft tissue disorders: Secondary | ICD-10-CM | POA: Diagnosis not present

## 2024-08-23 NOTE — Progress Notes (Signed)
 Orthopedic Surgery Post-operative Office Visit   Procedure: right tibia intramedullary rodding Date of Surgery: 02/18/2024 (~6 months post-op)   Assessment: Patient is a 58 y.o. who is improving after his right comminuted tibia fracture and associated fibula fractures     Plan: -Operative plans complete -Weight bearing as tolerated right lower extremity, activity as tolerated -OTC pain medications as needed -Return to office in 6 months, x-rays needed at next visit: right tibia AP/lateral   ___________________________________________________________________________     Subjective: Patient states that he is doing spectacularly.  He is not having any pain in the right leg.  He has been playing golf.  He is got back to jogging.  He is not taking any medication for pain.  He has not noticed any issues with the leg.  He is pleased with how he is doing.  Objective:   General: no acute distress, appropriate affect Neurologic: alert, answering questions appropriately, following commands Respiratory: unlabored breathing on room air Skin: incisions are well healed    MSK (RLE): ambulates without assistive devices, nonantalgic gait, knee ROM from 0-100, ankle ROM from neutral to 30 degrees plantarflexion, EHL/TA/GSC intact, sensation intact to light touch in sural/saphenous/deep peroneal/superficial peroneal/tibial nerve distributions, foot warm and well-perfused, palpable DP pulse   Imaging: XRs of the right tibia from 08/23/2024 were independently reviewed and interpreted, showing increased callus formation at both fibula fractures and the tibia fracture when compared to prior films on 05/23/2024.  There is bridging bone seen at the distal fibula fracture and the tibia fracture.  There is a rod in the tibia across the fracture site.  No lucency seen around the interlocking screws or the rod.  No new fracture seen.  Fracture alignment unchanged since prior films on 05/23/2024.     Patient name:  Kevin [name] Patient MRN: [id number] Date of visit: 08/23/24  Pre-operative Scores   VAS leg: 7/10  6 Month Post-operative Scores  VAS leg: 0/10

## 2024-09-03 ENCOUNTER — Encounter: Payer: Self-pay | Admitting: Radiology

## 2024-11-07 ENCOUNTER — Other Ambulatory Visit (HOSPITAL_COMMUNITY): Payer: Self-pay

## 2024-11-09 ENCOUNTER — Other Ambulatory Visit (HOSPITAL_COMMUNITY): Payer: Self-pay

## 2025-02-21 ENCOUNTER — Ambulatory Visit: Admitting: Orthopedic Surgery
